# Patient Record
Sex: Female | Born: 1995 | Race: Black or African American | Hispanic: No | Marital: Single | Smoking: Never smoker
Health system: Southern US, Community
[De-identification: ages and names within clinical notes are randomized; demographics above are authoritative.]

## PROBLEM LIST (undated history)

## (undated) HISTORY — PX: NO PAST SURGERIES: SHX2092

---
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
## 2018-03-20 ENCOUNTER — Other Ambulatory Visit: Payer: Self-pay

## 2018-03-20 ENCOUNTER — Other Ambulatory Visit (HOSPITAL_BASED_OUTPATIENT_CLINIC_OR_DEPARTMENT_OTHER): Payer: Self-pay

## 2018-03-29 ENCOUNTER — Other Ambulatory Visit: Payer: Self-pay

## 2018-03-29 ENCOUNTER — Encounter (HOSPITAL_BASED_OUTPATIENT_CLINIC_OR_DEPARTMENT_OTHER): Payer: Self-pay

## 2018-03-29 ENCOUNTER — Emergency Department (HOSPITAL_BASED_OUTPATIENT_CLINIC_OR_DEPARTMENT_OTHER): Payer: Self-pay

## 2018-03-29 ENCOUNTER — Inpatient Hospital Stay (HOSPITAL_COMMUNITY): Payer: Self-pay

## 2018-03-29 ENCOUNTER — Inpatient Hospital Stay (HOSPITAL_BASED_OUTPATIENT_CLINIC_OR_DEPARTMENT_OTHER)
Admission: EM | Admit: 2018-03-29 | Discharge: 2018-03-29 | Disposition: A | Discharge status: Home / Self Care | Payer: Self-pay | Source: Ambulatory Visit | Attending: Obstetrics and Gynecology | Admitting: Obstetrics and Gynecology

## 2018-03-29 DIAGNOSIS — R1084 Generalized abdominal pain: Secondary | ICD-10-CM | POA: Insufficient documentation

## 2018-03-29 DIAGNOSIS — R42 Dizziness and giddiness: Secondary | ICD-10-CM | POA: Insufficient documentation

## 2018-03-29 DIAGNOSIS — R109 Unspecified abdominal pain: Secondary | ICD-10-CM

## 2018-03-29 DIAGNOSIS — Z79899 Other long term (current) drug therapy: Secondary | ICD-10-CM | POA: Insufficient documentation

## 2018-03-29 DIAGNOSIS — R55 Syncope and collapse: Secondary | ICD-10-CM

## 2018-03-29 DIAGNOSIS — I959 Hypotension, unspecified: Secondary | ICD-10-CM

## 2018-03-29 DIAGNOSIS — N83201 Unspecified ovarian cyst, right side: Secondary | ICD-10-CM | POA: Insufficient documentation

## 2018-03-29 DIAGNOSIS — R58 Hemorrhage, not elsewhere classified: Secondary | ICD-10-CM

## 2018-03-29 DIAGNOSIS — K5900 Constipation, unspecified: Secondary | ICD-10-CM | POA: Insufficient documentation

## 2018-03-29 LAB — I-STAT CG4 LACTIC ACID, ED
LACTIC ACID, VENOUS: 1.65 mmol/L (ref 0.5–1.9)
Lactic Acid, Venous: 3.11 mmol/L (ref 0.5–1.9)

## 2018-03-29 LAB — CBC
HEMATOCRIT: 29.9 % — AB (ref 36.0–46.0)
HEMATOCRIT: 25.7 % — AB (ref 36.0–46.0)
HEMOGLOBIN: 10.4 g/dL — AB (ref 12.0–15.0)
Hemoglobin: 8.7 g/dL — ABNORMAL LOW (ref 12.0–15.0)
MCH: 29.6 pg (ref 26.0–34.0)
MCH: 29.1 pg (ref 26.0–34.0)
MCHC: 34.8 g/dL (ref 30.0–36.0)
MCHC: 33.9 g/dL (ref 30.0–36.0)
MCV: 85.2 fL (ref 78.0–100.0)
MCV: 86 fL (ref 78.0–100.0)
PLATELETS: 218 10*3/uL (ref 150–400)
Platelets: 281 10*3/uL (ref 150–400)
RBC: 3.51 MIL/uL — AB (ref 3.87–5.11)
RBC: 2.99 MIL/uL — ABNORMAL LOW (ref 3.87–5.11)
RDW: 11.8 % (ref 11.5–15.5)
RDW: 12.5 % (ref 11.5–15.5)
WBC: 8.9 10*3/uL (ref 4.0–10.5)
WBC: 9.1 10*3/uL (ref 4.0–10.5)

## 2018-03-29 LAB — TYPE AND SCREEN
ABO/RH(D): O NEG
Antibody Screen: NEGATIVE

## 2018-03-29 LAB — CBG MONITORING, ED: Glucose-Capillary: 164 mg/dL — ABNORMAL HIGH (ref 65–99)

## 2018-03-29 LAB — PREGNANCY, URINE: PREG TEST UR: NEGATIVE

## 2018-03-29 LAB — HEPATIC FUNCTION PANEL
ALT: 13 U/L — AB (ref 14–54)
AST: 29 U/L (ref 15–41)
Albumin: 3.8 g/dL (ref 3.5–5.0)
Alkaline Phosphatase: 50 U/L (ref 38–126)
Bilirubin, Direct: 0.2 mg/dL (ref 0.1–0.5)
Indirect Bilirubin: 0.7 mg/dL (ref 0.3–0.9)
TOTAL PROTEIN: 6.4 g/dL — AB (ref 6.5–8.1)
Total Bilirubin: 0.9 mg/dL (ref 0.3–1.2)

## 2018-03-29 LAB — URINALYSIS, ROUTINE W REFLEX MICROSCOPIC
Bilirubin Urine: NEGATIVE
Glucose, UA: 100 mg/dL — AB
Hgb urine dipstick: NEGATIVE
Ketones, ur: NEGATIVE mg/dL
Nitrite: NEGATIVE
PH: 6 (ref 5.0–8.0)
Protein, ur: NEGATIVE mg/dL
SPECIFIC GRAVITY, URINE: 1.025 (ref 1.005–1.030)

## 2018-03-29 LAB — BASIC METABOLIC PANEL
ANION GAP: 11 (ref 5–15)
BUN: 12 mg/dL (ref 6–20)
CO2: 18 mmol/L — ABNORMAL LOW (ref 22–32)
Calcium: 8.8 mg/dL — ABNORMAL LOW (ref 8.9–10.3)
Chloride: 103 mmol/L (ref 101–111)
Creatinine, Ser: 0.76 mg/dL (ref 0.44–1.00)
GFR calc Af Amer: >60 mL/min (ref >60)
GFR calc non Af Amer: >60 mL/min (ref >60)
GLUCOSE: 166 mg/dL — AB (ref 65–99)
POTASSIUM: 3.5 mmol/L (ref 3.5–5.1)
SODIUM: 132 mmol/L — AB (ref 135–145)

## 2018-03-29 LAB — DIFFERENTIAL
BASOS ABS: 0 10*3/uL (ref 0.0–0.1)
BASOS PCT: 0 %
EOS ABS: 0 10*3/uL (ref 0.0–0.7)
Eosinophils Relative: 0 %
Lymphocytes Relative: 21 %
Lymphs Abs: 1.9 10*3/uL (ref 0.7–4.0)
MONO ABS: 0.4 10*3/uL (ref 0.1–1.0)
Monocytes Relative: 5 %
Neutro Abs: 6.6 10*3/uL (ref 1.7–7.7)
Neutrophils Relative %: 74 %

## 2018-03-29 LAB — OCCULT BLOOD X 1 CARD TO LAB, STOOL: FECAL OCCULT BLD: NEGATIVE

## 2018-03-29 LAB — ABO/RH: ABO/RH(D): O NEG

## 2018-03-29 LAB — LIPASE, BLOOD: Lipase: 23 U/L (ref 11–51)

## 2018-03-29 LAB — URINALYSIS, MICROSCOPIC (REFLEX)

## 2018-03-29 LAB — HEMOGLOBIN AND HEMATOCRIT, BLOOD
HCT: 24.8 % — ABNORMAL LOW (ref 36.0–46.0)
Hemoglobin: 8.5 g/dL — ABNORMAL LOW (ref 12.0–15.0)

## 2018-03-29 LAB — HCG, QUANTITATIVE, PREGNANCY: hCG, Beta Chain, Quant, S: <1 m[IU]/mL (ref <5)

## 2018-03-29 MED ORDER — PIPERACILLIN-TAZOBACTAM 3.375 G IVPB 30 MIN
3.3750 g | Freq: Once | INTRAVENOUS | Status: AC
  Administered 2018-03-29: 3.375 g via INTRAVENOUS
  Filled 2018-03-29 (×2): qty 50

## 2018-03-29 MED ORDER — IOPAMIDOL (ISOVUE-300) INJECTION 61%
100.0000 mL | Freq: Once | INTRAVENOUS | Status: AC | PRN
  Administered 2018-03-29: 100 mL via INTRAVENOUS

## 2018-03-29 MED ORDER — SODIUM CHLORIDE 0.9 % IV SOLN
1000.0000 mL | INTRAVENOUS | Status: DC
  Administered 2018-03-29: 1000 mL via INTRAVENOUS

## 2018-03-29 MED ORDER — VANCOMYCIN HCL IN DEXTROSE 1-5 GM/200ML-% IV SOLN
1000.0000 mg | Freq: Once | INTRAVENOUS | Status: AC
  Administered 2018-03-29: 1000 mg via INTRAVENOUS
  Filled 2018-03-29: qty 200

## 2018-03-29 MED ORDER — SODIUM CHLORIDE 0.9 % IV BOLUS
1000.0000 mL | Freq: Once | INTRAVENOUS | Status: AC
  Administered 2018-03-29: 1000 mL via INTRAVENOUS

## 2018-03-29 MED ORDER — FENTANYL CITRATE (PF) 100 MCG/2ML IJ SOLN
50.0000 ug | Freq: Once | INTRAMUSCULAR | Status: AC
  Administered 2018-03-29: 50 ug via INTRAVENOUS
  Filled 2018-03-29: qty 2

## 2018-03-29 MED ORDER — ONDANSETRON HCL 4 MG/2ML IJ SOLN
4.0000 mg | Freq: Once | INTRAMUSCULAR | Status: AC
Start: 2018-03-29 — End: 2018-03-29
  Administered 2018-03-29: 4 mg via INTRAVENOUS
  Filled 2018-03-29: qty 2

## 2018-03-29 NOTE — ED Provider Notes (Signed)
TIME SEEN: 1:45 AM  CHIEF COMPLAINT: Abdominal pain, syncope  HPI: Patient is a 22 year old female with no significant past medical history who presents to the emergency department with complaints of constipation.  States she has not had a bowel movement since Sunday.  States at that time it was hard but she did not notice any blood or melena.  States that at home with her sister she had a syncopal event prior to arrival where she felt very lightheaded and short of breath.  She did not have any chest pain.  No chest pain or shortness of breath at this time.  She states she thinks she felt short of breath just because of the abdominal pain.  Most the pain is throughout the lower right and left abdomen.  She describes as a 5/10 at this time.  No nausea or vomiting.  No diarrhea.  No fever.  No cough or congestion.  No history of abdominal surgeries.  She is sexually active.  Last sexual encounter was in February.  Last menstrual period was March 5.  She has never been pregnant.  She reports history of abnormal menstrual cycles.  No history of heavy vaginal bleeding.  She has never been pregnant.  She does not have a PCP or OB/GYN.  She has never been told that she is anemic.  No history of blood transfusion.  She denies dysuria, hematuria, vaginal bleeding or discharge.  She reports her blood pressure is normally in the 110/70s.  Not on blood pressure medication.   ROS: See HPI Constitutional: no fever  Eyes: no drainage  ENT: no runny nose   Cardiovascular:  no chest pain  Resp: no SOB  GI: no vomiting GU: no dysuria Integumentary: no rash  Allergy: no hives  Musculoskeletal: no leg swelling  Neurological: no slurred speech ROS otherwise negative  PAST MEDICAL HISTORY/PAST SURGICAL HISTORY:  History reviewed. No pertinent past medical history.  MEDICATIONS:  Prior to Admission medications   Medication Sig Start Date End Date Taking? Authorizing Provider  polyethylene glycol powder (MIRALAX)  powder Take 17 g by mouth daily. 11/26/15   Palumbo, April, MD    ALLERGIES:  No Known Allergies  SOCIAL HISTORY:  Social History   Tobacco Use  . Smoking status: Never Smoker  . Smokeless tobacco: Never Used  Substance Use Topics  . Alcohol use: No    FAMILY HISTORY: History reviewed. No pertinent family history.  EXAM: BP 91/66   Pulse 90   Temp 98 F (36.7 C) (Oral)   Resp 16   Ht 5\' 2"  (1.575 m)   Wt 54.6 kg (120 lb 5.9 oz)   LMP 02/07/2018   SpO2 100%   BMI 22.02 kg/m  CONSTITUTIONAL: Alert and oriented and responds appropriately to questions. Well-appearing; well-nourished smiling, in no distress, afebrile HEAD: Normocephalic EYES: Conjunctivae appear pale, pupils appear equal, EOMI ENT: normal nose; moist mucous membranes NECK: Supple, no meningismus, no nuchal rigidity, no LAD  CARD: Regular and tachycardic; S1 and S2 appreciated; no murmurs, no clicks, no rubs, no gallops RESP: Normal chest excursion without splinting or tachypnea; breath sounds clear and equal bilaterally; no wheezes, no rhonchi, no rales, no hypoxia or respiratory distress, speaking full sentences ABD/GI: Normal bowel sounds; non-distended; soft, mildly tender throughout the lower abdomen, no rebound, no guarding, no peritoneal signs, no hepatosplenomegaly RECTAL:  Normal rectal tone, no gross blood or melena, guaiac negative, no hemorrhoids appreciated, nontender rectal exam, no fecal impaction BACK:  The back appears  normal and is non-tender to palpation, there is no CVA tenderness EXT: Normal ROM in all joints; non-tender to palpation; no edema; normal capillary refill; no cyanosis, no calf tenderness or swelling    SKIN: Normal color for age and race; warm; no rash NEURO: Moves all extremities equally PSYCH: The patient's mood and manner are appropriate. Grooming and personal hygiene are appropriate.  MEDICAL DECISION MAKING: Patient here with lower abdominal pain, syncope.  Differential  concerning for ectopic pregnancy, perforated viscus, sepsis.  We will start IV fluids and obtain labs, urine and upright chest and abdominal x-rays.  EKG shows no ischemic abnormality.  I doubt that this is pulmonary embolus especially in the setting of not having chest pain or shortness of breath currently.  ED PROGRESS: Patient's rectal temperature is normal.  She does have an elevated lactate and with tachycardia and hypotension I have initiated sepsis protocol.  She will received 2 L of IV fluids as well as broad-spectrum antibiotics.   Labs reassuring.  No leukocytosis.  Normal creatinine, LFTs and lipase.  Chest and abdominal x-rays have been reviewed by myself as well as radiology and showed no acute abnormality.  Pregnancy test is negative.  Urine shows no obvious sign of infection.  It appears to be a contaminated sample.  She is not having urinary symptoms.  Blood pressure and heart rate improving with IV hydration.  Will give fentanyl for pain control.  She is still mentating normally, smiling and laughing.  Will obtain CT scan for further evaluation.  Concern for possible appendicitis.   Patient's repeat lactate has improved.  Discussed with Dr. Cherly Hensenhang with radiology.  He states that patient appears to have active hemorrhage coming from a cystic structure noted on the right adnexa.  He states that this appears more concerning for ruptured ectopic pregnancy but she does have a negative urine pregnancy here.  Will add on a quantitative hCG now.  She does not have an OB/GYN.  Will discuss with [facility] emergent [facility].  Radiology states that this could be ruptured ovarian cyst but seems to be more blood than he would expect from a cyst.  5:00 AM  D/w Dr. Earlene Plateravis on call for OBGYN.  Appreciate her help.  She agrees with emergent [facility].  We will repeat H&H at this time.  Blood pressure is in the 100/70s and heart rate in the 110's.  Patient has  been updated with this plan.  She is n.p.o. at this time.   5:30 AM  Pt's repeat hemoglobin is 8.5 down from 10.4.  Her hCG quant is 0.  [facility] at bedside for [facility].   I reviewed all nursing notes, vitals, pertinent previous records, EKGs, lab and urine results, imaging (as available).     EKG Interpretation  Date/Time:  Tuesday March 29 2018 02:45:08 EDT Ventricular Rate:  140 PR Interval:    QRS Duration: 75 QT Interval:  394 QTC Calculation: 602 R Axis:   85 Text Interpretation:  Sinus tachycardia Borderline T wave abnormalities Prolonged QT interval Confirmed by Rochele RaringWard, Kristen [phone]([id number]) on 03/29/2018 5:04:33 AM            CRITICAL CARE Performed by: Baxter HireKristen Ward   Total critical care time: 60 minutes  Critical care time was exclusive of separately billable procedures and treating other patients.  Critical care was necessary to treat or prevent imminent or life-threatening deterioration.  Critical care was time spent personally  by me on the following activities: development of treatment plan with patient and/or surrogate as well as nursing, discussions with consultants, evaluation of patient's response to treatment, examination of patient, obtaining history from patient or surrogate, ordering and performing treatments and interventions, ordering and review of laboratory studies, ordering and review of radiographic studies, pulse oximetry and re-evaluation of patient's condition.    Ward, Layla Maw, DO 03/29/18 [phone]

## 2018-03-29 NOTE — ED Notes (Signed)
Critical Lactic result handed to Dr Elesa MassedWard.

## 2018-03-29 NOTE — ED Notes (Signed)
Lactic results given to Dr Ward. 

## 2018-03-29 NOTE — MAU Provider Note (Addendum)
Patient Misty Gordon is a [age] y.o. G0P0000 Non-pregnant female here with abdominal pain. She developed abdominal pain and dizziness last night at midnight and [facility]  after her CT showed large amount of blood in her abdomen, although beta hcg was less than 1.   Patient had vomiting while en route and received 4 mg of Zofran. [facility] two liters of fluid.  History     CSN: [id number]  Arrival date and time: 03/29/18 0120   None     Chief Complaint  Patient presents with  . Loss of Consciousness   Loss of Consciousness  Associated symptoms include abdominal pain. Pertinent negatives include no vomiting.  Abdominal Pain  This is a new problem. The current episode started yesterday. The onset quality is gradual. The pain is located in the generalized abdominal region and suprapubic region. The pain is at a severity of 6/10. The quality of the pain is cramping and sharp. Pertinent negatives include no diarrhea or vomiting.  She vomited around 7 pm; she had a BM around  3pm on Monday. Before that she had a BM on Sunday. Both BM were small.   Her pain picked around 8 pm; it slowed down at 9 pm. She got dizzy (she did not lose consciousness) at midnight and went to urgent care, where they did a pregnancy test, blood work, CT showed bleeding in her abdomen.   She only had spinach yesterday bc she was not hungry. Sunday she chipotle, biscuit, and a [place] barbecue sandwich and fries.   OB History    Gravida  0   Para  0   Term  0   Preterm  0   AB  0   Living  0     SAB  0   TAB  0   Ectopic  0   Multiple  0   Live Births  0           History reviewed. No pertinent past medical history.  Past Surgical History:  Procedure Laterality Date  . NO PAST SURGERIES      History reviewed. No pertinent family history.  Social History   Tobacco Use  . Smoking status: Never Smoker  . Smokeless tobacco: Never Used  Substance Use  Topics  . Alcohol use: No  . Drug use: No    Allergies: No Known Allergies  Medications Prior to Admission  Medication Sig Dispense Refill Last Dose  . polyethylene glycol powder (MIRALAX) powder Take 17 g by mouth daily. 255 g 0 More than a month at Unknown time    Review of Systems  Respiratory: Negative.   Cardiovascular: Positive for syncope.  Gastrointestinal: Positive for abdominal pain. Negative for diarrhea and vomiting.  Genitourinary: Negative.   Neurological: Positive for syncope.   Physical Exam   Blood pressure (!) 102/52, pulse (!) 108, temperature 98.6 F (37 C), temperature source Oral, resp. rate 17, height 5\' 2"  (1.575 m), weight 120 lb 5.9 oz (54.6 kg), last menstrual period 02/07/2018, SpO2 100 %.  Physical Exam  Constitutional: She is oriented to person, place, and time. She appears well-developed.  HENT:  Head: Normocephalic.  Neck: Normal range of motion.  GI: Soft. She exhibits no distension and no mass. There is no tenderness. There is no rebound and no guarding.  Musculoskeletal: Normal range of motion.  Neurological: She is alert and oriented to person, place, and time.  Skin: Skin is warm.  MAU Course  Procedures  MDM Plan of care at 0740: Repeat beta hcg and CBC -continue NPO  Patient continues to be alert and oriented times 3, abdomen is soft and non-tender. Patient is smiling and chatty with pain level a 3/10. She has received no pain medication while in MAU.   -of note, her Hemoglobin dropped from 10.4 at 1 am to 8.5 at 5 am, although this may be due to hemodilution.   -Dr. Earlene Plater at the bedside; will discuss with Dr. Macon Large and determine plan of care.  Patient care endorsed to [facility] at 0800.   Charlesetta Garibaldi Kooistra 03/29/2018, 7:01 AM    Attestation of Attending Supervision of Advanced Practice Provider (PA/CNM/NP): Evaluation and management procedures were performed by the Advanced Practice Provider under my supervision and  collaboration.  I have reviewed the Advanced Practice Provider's note and chart, and I agree with the management and plan.  Patient examined, vitals stable, soft abdomen, repeat Hgb is 8.7.  Discussed with patient and her family. No need for operative intervention at this point.  NSAIDs recommended for pain as needed. She will follow up with GYN in 2-3 weeks; message sent to [facility] to make appointment.  Bleeding and pain precautions reviewed with patient.     Jaynie Collins, MD, FACOG Obstetrician & Gynecologist, [facility] for [employer], [facility]

## 2018-03-29 NOTE — ED Triage Notes (Signed)
Pt reports witnessed syncopal episode, constipation since Sunday, and 5/10 abd pain. Pt denies n/v. Pt hypotensive in triage.75/50 BP

## 2018-03-29 NOTE — Discharge Instructions (Signed)
Ovarian Cyst An ovarian cyst is a fluid-filled sac on an ovary. The ovaries are organs that make eggs in women. Most ovarian cysts go away on their own and are not cancerous (are benign). Some cysts need treatment. Follow these instructions at home:  Take over-the-counter and prescription medicines only as told by your doctor.  Do not drive or use heavy machinery while taking prescription pain medicine.  Get pelvic exams and Pap tests as often as told by your doctor.  Return to your normal activities as told by your doctor. Ask your doctor what activities are safe for you.  Do not use any products that contain nicotine or tobacco, such as cigarettes and e-cigarettes. If you need help quitting, ask your doctor.  Keep all follow-up visits as told by your doctor. This is important. Contact a doctor if:  Your periods are: ? Late. ? Irregular. ? Painful.  Your periods stop.  You have pelvic pain that does not go away.  You have pressure on your bladder.  You have trouble making your bladder empty when you pee (urinate).  You have pain during sex.  You have any of the following in your belly (abdomen): ? A feeling of fullness. ? Pressure. ? Discomfort. ? Pain that does not go away. ? Swelling.  You feel sick most of the time.  You have trouble pooping (have constipation).  You are not as hungry as usual (you lose your appetite).  You get very bad acne.  You start to have more hair on your body and face.  You are gaining weight or losing weight without changing your exercise and eating habits.  You think you may be pregnant. Get help right away if:  You have belly pain that is very bad or gets worse.  You cannot eat or drink without throwing up (vomiting).  You suddenly get a fever.  Your period is a lot heavier than usual. This information is not intended to replace advice given to you by your health care provider. Make sure you discuss any questions you have  with your health care provider. Document Released: 05/11/2008 Document Revised: 06/12/2016 Document Reviewed: 04/26/2016 Elsevier Interactive Patient Education  2018 [employer]  

## 2018-04-03 LAB — CULTURE, BLOOD (ROUTINE X 2)
CULTURE: NO GROWTH
Culture: NO GROWTH
SPECIAL REQUESTS: ADEQUATE

## 2018-04-20 ENCOUNTER — Encounter: Payer: Self-pay | Admitting: Obstetrics & Gynecology

## 2018-05-12 ENCOUNTER — Ambulatory Visit: Payer: BLUE CROSS/BLUE SHIELD | Admitting: Obstetrics and Gynecology

## 2018-05-12 ENCOUNTER — Encounter: Payer: Self-pay | Admitting: Obstetrics and Gynecology

## 2018-05-12 VITALS — BP 111/71 | HR 85 | Ht 62.0 in | Wt 112.0 lb

## 2018-05-12 DIAGNOSIS — Z09 Encounter for follow-up examination after completed treatment for conditions other than malignant neoplasm: Secondary | ICD-10-CM

## 2018-05-12 MED ORDER — LO LOESTRIN FE 1 MG-10 MCG / 10 MCG PO TABS: 1.0000 | ORAL_TABLET | Freq: Every day | ORAL | 3 refills | Status: AC

## 2018-05-12 NOTE — Progress Notes (Signed)
22 yo G0 here for follow up from and ED visit on 4/23. Patient seen and discharged on 4/23 with likely bleeding hemorrhagic cyst. Patient reports feeling much better and without pain since her ED visit. She is without complaints. She is not currently sexually active and is interested in contraception. She reports a monthly 5-day period. She denies pelvic pain or abnormal discharge  No past medical history on file. Past Surgical History:  Procedure Laterality Date  . NO PAST SURGERIES     No family history on file. Social History   Tobacco Use  . Smoking status: Never Smoker  . Smokeless tobacco: Never Used  Substance Use Topics  . Alcohol use: No  . Drug use: No   ROS See pertinent in HPI  Blood pressure 111/71, pulse 85, height 5\' 2"  (1.575 m), weight 112 lb (50.8 kg).  GENERAL: Well-developed, well-nourished female in no acute distress.  ABDOMEN: Soft, nontender, nondistended. No organomegaly. PELVIC: Not performed EXTREMITIES: No cyanosis, clubbing, or edema, 2+ distal pulses.  A/P 22 yo G0 with h/o ruptured hemorrhagic cyst - Reassurance provided - Contraception options reviewed and patient is interested in COC. Rx Lo Loestrin provided - patient to return in fall for annual exam  - advised the continued usage of condoms for STD prevention

## 2018-05-12 NOTE — Patient Instructions (Signed)
Contraception Choices Contraception, also called birth control, refers to methods or devices that prevent pregnancy. Hormonal methods Contraceptive implant A contraceptive implant is a thin, plastic tube that contains a hormone. It is inserted into the upper part of the arm. It can remain in place for up to 3 years. Progestin-only injections Progestin-only injections are injections of progestin, a synthetic form of the hormone progesterone. They are given every 3 months by a health care provider. Birth control pills Birth control pills are pills that contain hormones that prevent pregnancy. They must be taken once a day, preferably at the same time each day. Birth control patch The birth control patch contains hormones that prevent pregnancy. It is placed on the skin and must be changed once a week for three weeks and removed on the fourth week. A prescription is needed to use this method of contraception. Vaginal ring A vaginal ring contains hormones that prevent pregnancy. It is placed in the vagina for three weeks and removed on the fourth week. After that, the process is repeated with a new ring. A prescription is needed to use this method of contraception. Emergency contraceptive Emergency contraceptives prevent pregnancy after unprotected sex. They come in pill form and can be taken up to 5 days after sex. They work best the sooner they are taken after having sex. Most emergency contraceptives are available without a prescription. This method should not be used as your only form of birth control. Barrier methods Female condom A female condom is a thin sheath that is worn over the penis during sex. Condoms keep sperm from going inside a woman's body. They can be used with a spermicide to increase their effectiveness. They should be disposed after a single use. Female condom A female condom is a soft, loose-fitting sheath that is put into the vagina before sex. The condom keeps sperm from going  inside a woman's body. They should be disposed after a single use. Diaphragm A diaphragm is a soft, dome-shaped barrier. It is inserted into the vagina before sex, along with a spermicide. The diaphragm blocks sperm from entering the uterus, and the spermicide kills sperm. A diaphragm should be left in the vagina for 6-8 hours after sex and removed within 24 hours. A diaphragm is prescribed and fitted by a health care provider. A diaphragm should be replaced every 1-2 years, after giving birth, after gaining more than 15 lb (6.8 kg), and after pelvic surgery. Cervical cap A cervical cap is a round, soft latex or plastic cup that fits over the cervix. It is inserted into the vagina before sex, along with spermicide. It blocks sperm from entering the uterus. The cap should be left in place for 6-8 hours after sex and removed within 48 hours. A cervical cap must be prescribed and fitted by a health care provider. It should be replaced every 2 years. Sponge A sponge is a soft, circular piece of polyurethane foam with spermicide on it. The sponge helps block sperm from entering the uterus, and the spermicide kills sperm. To use it, you make it wet and then insert it into the vagina. It should be inserted before sex, left in for at least 6 hours after sex, and removed and thrown away within 30 hours. Spermicides Spermicides are chemicals that kill or block sperm from entering the cervix and uterus. They can come as a cream, jelly, suppository, foam, or tablet. A spermicide should be inserted into the vagina with an applicator at least 10-15 minutes before   sex to allow time for it to work. The process must be repeated every time you have sex. Spermicides do not require a prescription. Intrauterine contraception Intrauterine device (IUD) An IUD is a T-shaped device that is put in a woman's uterus. There are two types:  Hormone IUD.This type contains progestin, a synthetic form of the hormone progesterone. This  type can stay in place for 3-5 years.  Copper IUD.This type is wrapped in copper wire. It can stay in place for 10 years.  Permanent methods of contraception Female tubal ligation In this method, a woman's fallopian tubes are sealed, tied, or blocked during surgery to prevent eggs from traveling to the uterus. Hysteroscopic sterilization In this method, a small, flexible insert is placed into each fallopian tube. The inserts cause scar tissue to form in the fallopian tubes and block them, so sperm cannot reach an egg. The procedure takes about 3 months to be effective. Another form of birth control must be used during those 3 months. Female sterilization This is a procedure to tie off the tubes that carry sperm (vasectomy). After the procedure, the man can still ejaculate fluid (semen). Natural planning methods Natural family planning In this method, a couple does not have sex on days when the woman could become pregnant. Calendar method This means keeping track of the length of each menstrual cycle, identifying the days when pregnancy can happen, and not having sex on those days. Ovulation method In this method, a couple avoids sex during ovulation. Symptothermal method This method involves not having sex during ovulation. The woman typically checks for ovulation by watching changes in her temperature and in the consistency of cervical mucus. Post-ovulation method In this method, a couple waits to have sex until after ovulation. Summary  Contraception, also called birth control, means methods or devices that prevent pregnancy.  Hormonal methods of contraception include implants, injections, pills, patches, vaginal rings, and emergency contraceptives.  Barrier methods of contraception can include female condoms, female condoms, diaphragms, cervical caps, sponges, and spermicides.  There are two types of IUDs (intrauterine devices). An IUD can be put in a woman's uterus to prevent pregnancy  for 3-5 years.  Permanent sterilization can be done through a procedure for males, females, or both.  Natural family planning methods involve not having sex on days when the woman could become pregnant. This information is not intended to replace advice given to you by your health care provider. Make sure you discuss any questions you have with your health care provider. Document Released: 11/23/2005 Document Revised: 12/26/2016 Document Reviewed: 12/26/2016 Elsevier Interactive Patient Education  2018 [employer]  

## 2018-06-14 IMAGING — CT CT ABD-PELV W/ CM
2 of 4 series · 15 of 46 positions shown, 17 images · IV contrast (iopamidol)
Comparison: None.

CLINICAL DATA: Acute onset of generalized abdominal pain. Syncope.
Hypotension.

EXAM:
CT ABDOMEN AND PELVIS WITH CONTRAST
TECHNIQUE: Multidetector CT imaging of the abdomen and pelvis was performed
using the standard protocol following bolus administration of
intravenous contrast.
CONTRAST:  100mL PCAJAA-4SS IOPAMIDOL (PCAJAA-4SS) INJECTION 61%

[Series 2: axial st · axial · 0.64mm/px · z∈[-468,-58]mm · 12 of 90 slices shown, 14 images]
[im 4/90  soft-tissue]
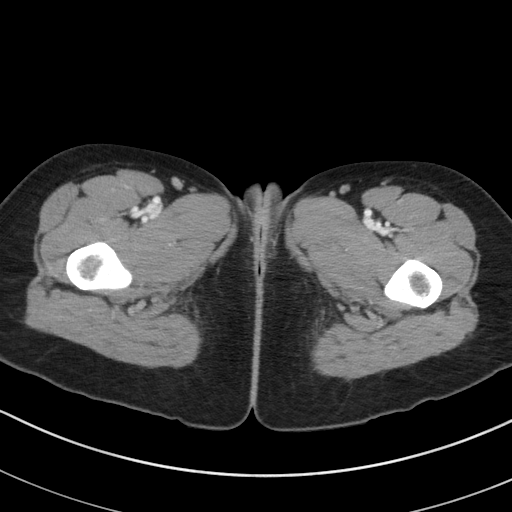
[im 4/90  bone]
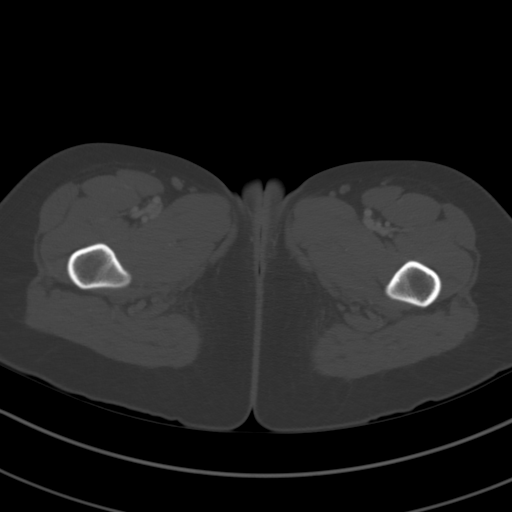
[im 12/90  soft-tissue]
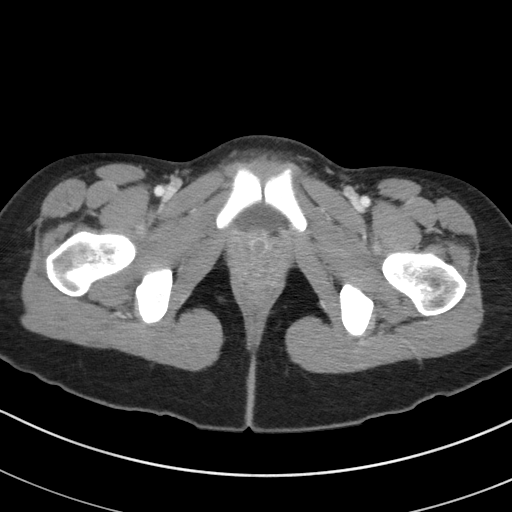
[im 19/90  soft-tissue]
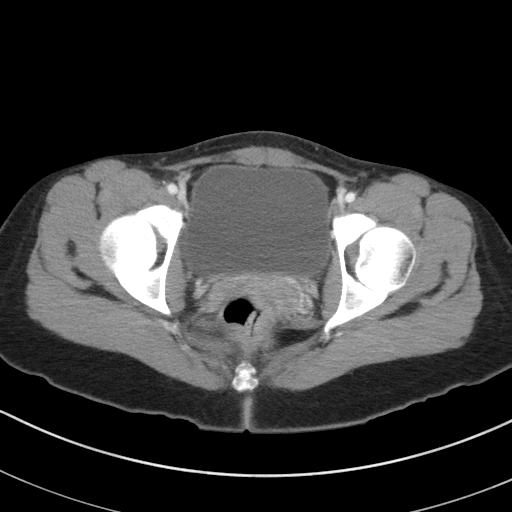
[im 26/90  soft-tissue]
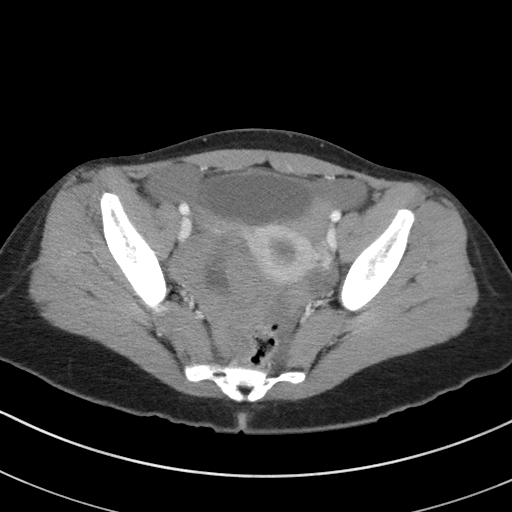
[im 34/90  soft-tissue]
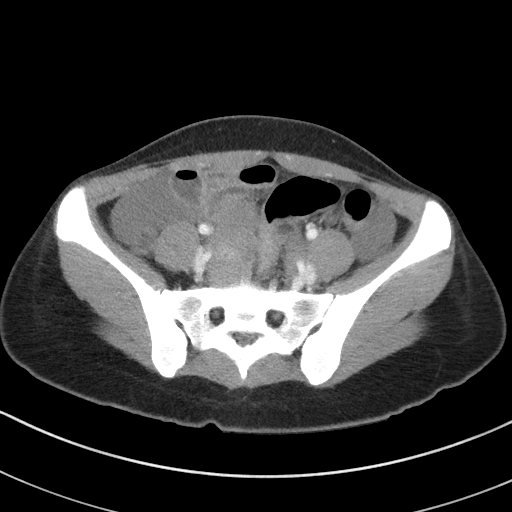
[im 41/90  soft-tissue]
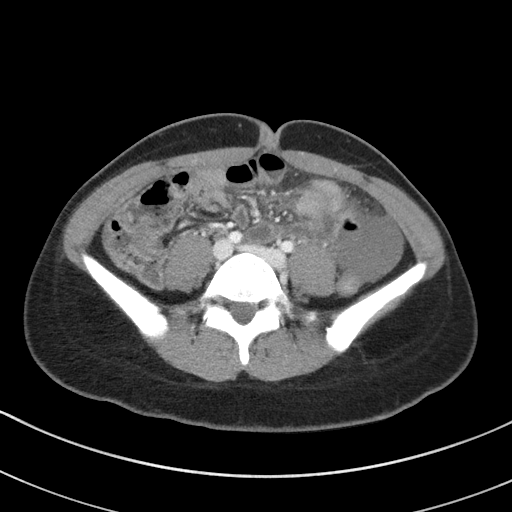
[im 49/90  soft-tissue]
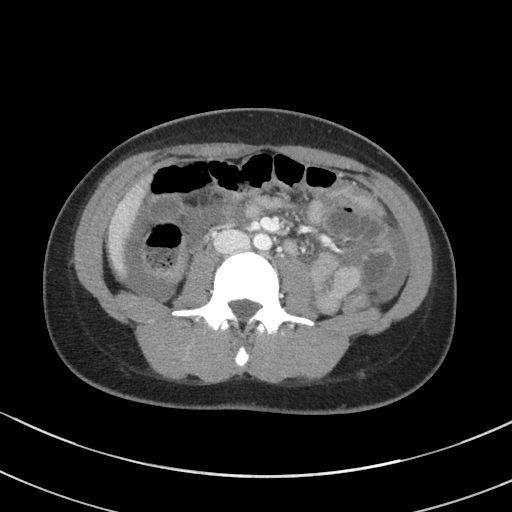
[im 56/90  soft-tissue]
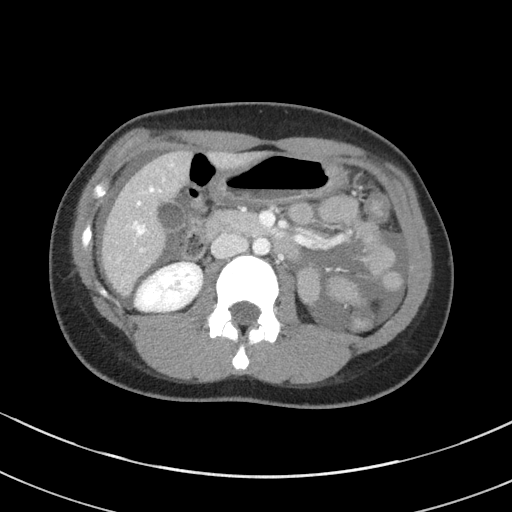
[im 64/90  soft-tissue]
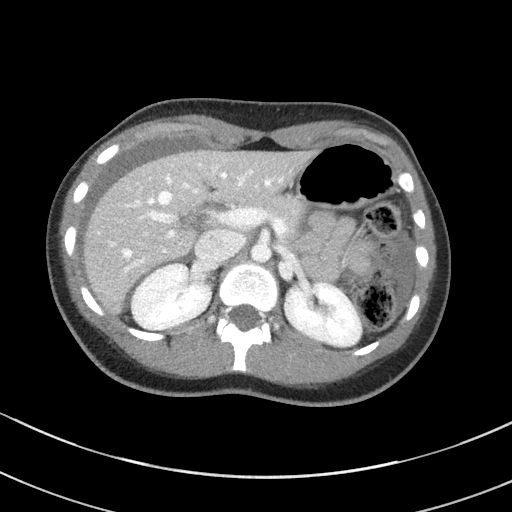
[im 64/90  bone]
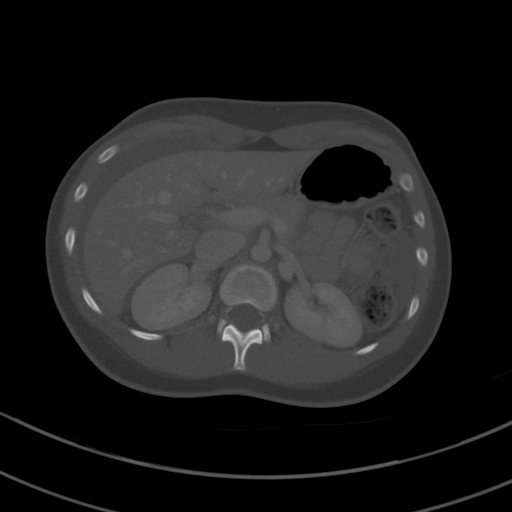
[im 71/90  soft-tissue]
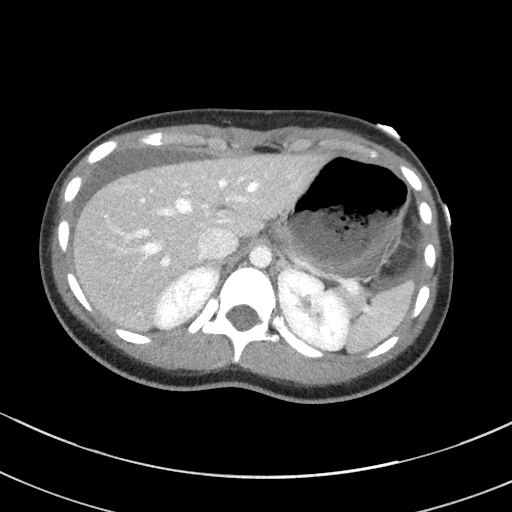
[im 78/90  soft-tissue]
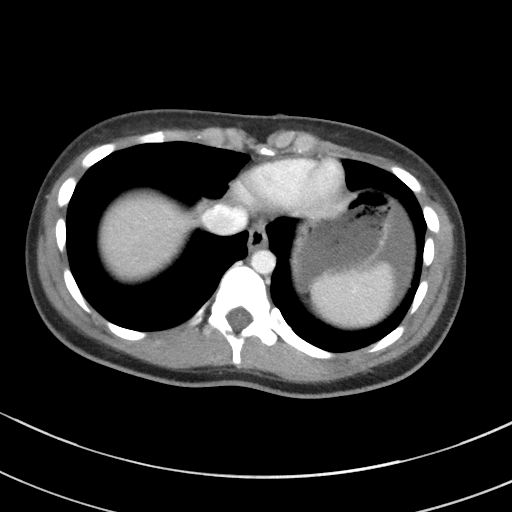
[im 86/90  soft-tissue]
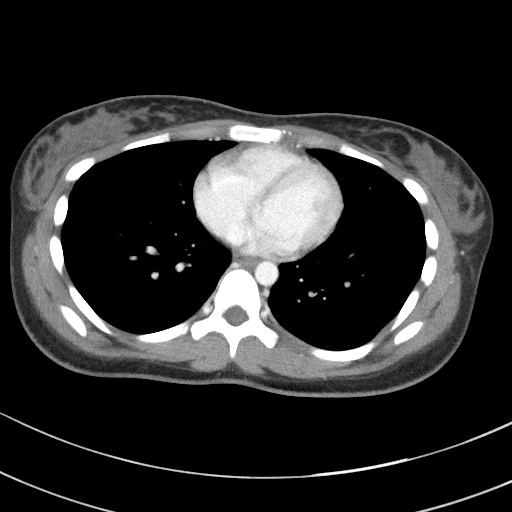

[Series 5: coronal st · coronal · 0.68mm/px · 3 of 64 slices shown]
[im 22/64  soft-tissue]
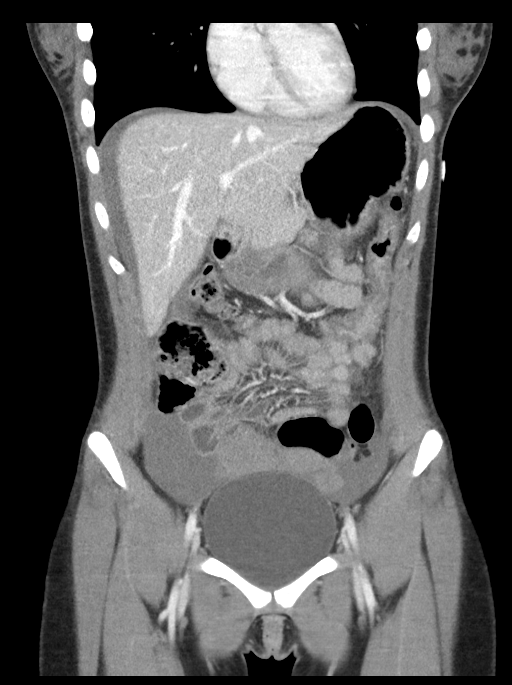
[im 29/64  soft-tissue]
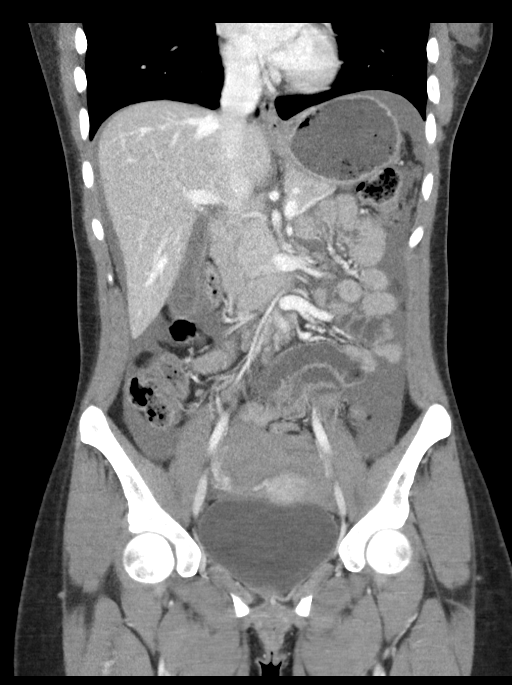
[im 36/64  soft-tissue]
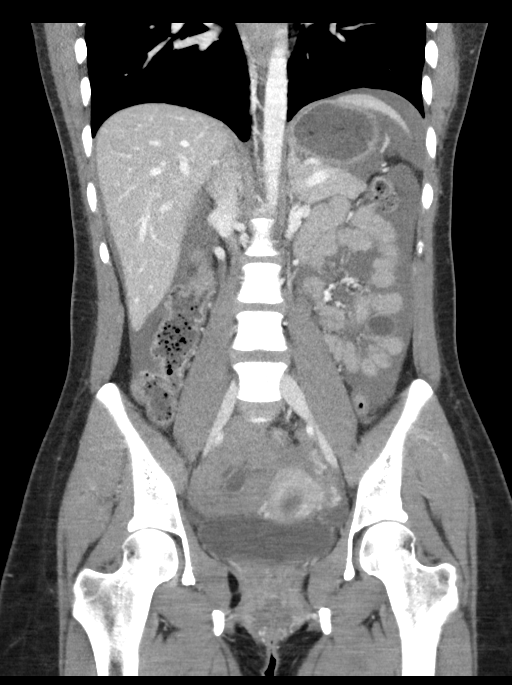

[15 of 46 positions shown; findings below may reference images not displayed]

FINDINGS: Lower chest: The visualized lung bases are grossly clear. The
visualized portions of the mediastinum are unremarkable.

Hepatobiliary: The liver is unremarkable in appearance. The
gallbladder is unremarkable in appearance. The common bile duct
remains normal in caliber.

Pancreas: The pancreas is within normal limits.

Spleen: The spleen is unremarkable in appearance.

Adrenals/Urinary Tract: The adrenal glands are unremarkable in
appearance. The kidneys are within normal limits. There is no
evidence of hydronephrosis. No renal or ureteral stones are
identified. No perinephric stranding is seen.

Stomach/Bowel: The stomach is unremarkable in appearance. The small
bowel is within normal limits. The appendix is normal in caliber,
without evidence of appendicitis. The colon is unremarkable in
appearance.

Vascular/Lymphatic: The abdominal aorta is unremarkable in
appearance. The inferior vena cava is grossly unremarkable. No
retroperitoneal lymphadenopathy is seen. No pelvic sidewall
lymphadenopathy is identified.

Reproductive: The bladder is mildly distended and grossly
unremarkable. The uterus and left ovary are unremarkable in
appearance.

There appears to be a complex 4.5 cm cystic structure at the right
adnexa, surrounded by a relatively large amount of clot in the
pelvis. Blood is seen tracking superiorly into abdomen, surrounding
the liver and spleen. This could conceivably reflect a ruptured
hemorrhagic cyst, though the quantity of blood is much more than
typically seen. Ruptured ectopic pregnancy is a concern, even given
the negative urine pregnancy test.

Other: No additional soft tissue abnormalities are seen.

Musculoskeletal: No acute osseous abnormalities are identified. The
visualized musculature is unremarkable in appearance.
IMPRESSION: Complex 4.5 cm cystic structure at the right adnexa, surrounded by a
relatively large amount of clot in the pelvis. Blood tracks
superiorly into the abdomen, surrounding the liver and spleen. This
could conceivably reflect a ruptured hemorrhagic cyst, though the
quantity of blood is much more than typically seen. Ruptured ectopic
pregnancy is a concern, even given the negative urine pregnancy
test.

Critical Value/emergent results were called by telephone at the time
of interpretation on 03/29/2018 at [DATE] to Dr. NAADIA IYKE, who
verbally acknowledged these results.

## 2018-07-05 ENCOUNTER — Telehealth: Payer: Self-pay | Admitting: Obstetrics and Gynecology

## 2018-07-05 NOTE — Telephone Encounter (Signed)
Patient want to get an US to see if she still have cyst

## 2021-10-16 ENCOUNTER — Emergency Department (HOSPITAL_BASED_OUTPATIENT_CLINIC_OR_DEPARTMENT_OTHER)
Admission: EM | Admit: 2021-10-16 | Discharge: 2021-10-16 | Disposition: A | Discharge status: Home / Self Care | Payer: BC Managed Care – PPO | Attending: Emergency Medicine | Admitting: Emergency Medicine

## 2021-10-16 ENCOUNTER — Encounter (HOSPITAL_BASED_OUTPATIENT_CLINIC_OR_DEPARTMENT_OTHER): Payer: Self-pay

## 2021-10-16 ENCOUNTER — Other Ambulatory Visit: Payer: Self-pay

## 2021-10-16 DIAGNOSIS — R059 Cough, unspecified: Secondary | ICD-10-CM | POA: Diagnosis present

## 2021-10-16 DIAGNOSIS — Z20822 Contact with and (suspected) exposure to covid-19: Secondary | ICD-10-CM | POA: Diagnosis not present

## 2021-10-16 DIAGNOSIS — R0982 Postnasal drip: Secondary | ICD-10-CM | POA: Diagnosis not present

## 2021-10-16 DIAGNOSIS — J069 Acute upper respiratory infection, unspecified: Secondary | ICD-10-CM | POA: Diagnosis not present

## 2021-10-16 LAB — RESP PANEL BY RT-PCR (FLU A&B, COVID) ARPGX2
Influenza A by PCR: POSITIVE — AB
Influenza B by PCR: NEGATIVE
SARS Coronavirus 2 by RT PCR: NEGATIVE

## 2021-10-16 MED ORDER — BENZONATATE 100 MG PO CAPS: 100.0000 mg | ORAL_CAPSULE | Freq: Three times a day (TID) | ORAL | 0 refills | Status: AC

## 2021-10-16 MED ORDER — ONDANSETRON 4 MG PO TBDP: ORAL_TABLET | ORAL | 0 refills | Status: AC

## 2021-10-16 NOTE — ED Notes (Signed)
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  Pt. States she has had runny nose and cough.  At times nausea and no diarrhea.

## 2021-10-16 NOTE — Discharge Instructions (Signed)
Take tylenol 2 pills 4 times a day and motrin 4 pills 3 times a day.  Drink plenty of fluids.  Return for worsening shortness of breath, headache, confusion. Follow up with your family doctor.   

## 2021-10-16 NOTE — ED Provider Notes (Signed)
[facility] EMERGENCY DEPARTMENT Provider Note   CSN: [id number] Arrival date & time: 10/16/21  1859     History Chief Complaint  Patient presents with   URI    Misty Gordon is a 25 y.o. female.  25 yo F with a chief complaints of cough congestion and fever.  Is been going on for about 3 to 4 days now.  She started having fevers and chills today.  Mild decreased oral intake.  No abdominal pain nausea or vomiting.  Headache sore throat.  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  The history is provided by the patient and a friend.  URI Presenting symptoms: congestion, cough and fever   Presenting symptoms: no rhinorrhea   Associated symptoms: no arthralgias, no headaches, no myalgias and no wheezing   Illness Severity:  Moderate Onset quality:  Gradual Duration:  4 days Timing:  Constant Progression:  Worsening Chronicity:  New Associated symptoms: congestion, cough and fever   Associated symptoms: no chest pain, no headaches, no myalgias, no nausea, no rhinorrhea, no shortness of breath, no vomiting and no wheezing       History reviewed. No pertinent past medical history.  There are no problems to display for this patient.   Past Surgical History:  Procedure Laterality Date   NO PAST SURGERIES       OB History     Gravida  0   Para  0   Term  0   Preterm  0   AB  0   Living  0      SAB  0   IAB  0   Ectopic  0   Multiple  0   Live Births  0           History reviewed. No pertinent family history.  Social History   Tobacco Use   Smoking status: Never   Smokeless tobacco: Never  Substance Use Topics   Alcohol use: No   Drug use: No    Home Medications Prior to Admission medications   Medication Sig Start Date End Date Taking? Authorizing Provider  benzonatate (TESSALON) 100 MG capsule Take 1 capsule (100 mg total) by mouth every 8 (eight) hours. 10/16/21  Yes Melene Plan, DO  ondansetron (ZOFRAN ODT) 4 MG disintegrating tablet 4mg  ODT q4 hours prn nausea/vomit 10/16/21  Yes Nelvin Tomb, DO  LO LOESTRIN FE 1 MG-10 MCG / 10 MCG tablet Take 1 tablet by mouth daily. 05/12/18   Constant, Peggy, MD    Allergies    Patient has no known allergies.  Review of Systems   Review of Systems  Constitutional:  Positive for fever. Negative for chills.  HENT:  Positive for congestion. Negative for rhinorrhea.   Eyes:  Negative for redness and visual disturbance.  Respiratory:  Positive for cough. Negative for shortness of breath and wheezing.   Cardiovascular:  Negative for chest pain and palpitations.  Gastrointestinal:  Negative for nausea and vomiting.  Genitourinary:  Negative for dysuria and urgency.  Musculoskeletal:  Negative for arthralgias and myalgias.  Skin:  Negative for pallor and wound.  Neurological:  Negative for dizziness and headaches.   Physical Exam Updated Vital Signs BP (!) 126/94 (BP Location: Left Arm)   Pulse (!) 142   Temp (!) 100.4 F (38 C) (Oral)   Resp 16   Ht 5\' 2"  (1.575 m)   Wt 68.7 kg  LMP 10/14/2021 (Approximate)   SpO2 99%   BMI 27.70 kg/m   Physical Exam Vitals and nursing note reviewed.  Constitutional:      General: She is not in acute distress.    Appearance: She is well-developed. She is not diaphoretic.  HENT:     Head: Normocephalic and atraumatic.     Comments: Swollen turbinates, posterior nasal drip, no noted sinus ttp, tm normal bilaterally.   Eyes:     Pupils: Pupils are equal, round, and reactive to light.  Cardiovascular:     Rate and Rhythm: Normal rate and regular rhythm.     Heart sounds: No murmur heard.   No friction rub. No gallop.  Pulmonary:     Effort: Pulmonary effort is normal.     Breath sounds: No wheezing or rales.  Abdominal:     General: There is no distension.     Palpations: Abdomen is soft.     Tenderness: There is no abdominal tenderness.  Musculoskeletal:        General:  No tenderness.     Cervical back: Normal range of motion and neck supple.  Skin:    General: Skin is warm and dry.  Neurological:     Mental Status: She is alert and oriented to person, place, and time.  Psychiatric:        Behavior: Behavior normal.    ED Results / Procedures / Treatments   Labs (all labs ordered are listed, but only abnormal results are displayed) Labs Reviewed  RESP PANEL BY RT-PCR (FLU A&B, COVID) [id number]    EKG None  Radiology No results found.  Procedures Procedures   Medications Ordered in ED Medications - No data to display  ED Course  I have reviewed the triage vital signs and the nursing notes.  Pertinent labs & imaging results that were available during my care of the patient were reviewed by me and considered in my medical decision making (see chart for details).    MDM Rules/Calculators/A&P                           25 yo F with a cc of cough, congestion fevers, chills.  Going on for the past four days.  Worsening.  Well appearing and non toxic. Clear lung sounds, no bacterial source found on exam.    7:31 PM:  I have discussed the diagnosis/risks/treatment options with the patient and believe the pt to be eligible for discharge home to follow-up with PCP. We also discussed returning to the ED immediately if new or worsening sx occur. We discussed the sx which are most concerning (e.g., sudden worsening pain, fever, inability to tolerate by mouth) that necessitate immediate return. Medications administered to the patient during their visit and any new prescriptions provided to the patient are listed below.  Medications given during this visit Medications - No data to display   The patient appears reasonably screen and/or stabilized for discharge and I doubt any other medical condition or other [facility] requiring further screening, evaluation, or treatment in the ED at this time prior to discharge.    Final Clinical Impression(s) / ED  Diagnoses Final diagnoses:  Viral upper respiratory tract infection    Rx / DC Orders ED Discharge Orders          Ordered    benzonatate (TESSALON) 100 MG capsule  Every 8 hours        10/16/21 1923  ondansetron (ZOFRAN ODT) 4 MG disintegrating tablet        10/16/21 1923             Deno Etienne, DO 10/16/21 1931

## 2021-10-16 NOTE — ED Triage Notes (Signed)
Pt c/o chills, bodyaches, cough, congestion, fever. Cough started Tuesday, other symptoms started today.

## 2021-11-05 ENCOUNTER — Ambulatory Visit (HOSPITAL_COMMUNITY)
Admission: EM | Admit: 2021-11-05 | Discharge: 2021-11-05 | Disposition: A | Discharge status: Home / Self Care | Payer: BC Managed Care – PPO | Attending: Family Medicine | Admitting: Family Medicine

## 2021-11-05 ENCOUNTER — Other Ambulatory Visit: Payer: Self-pay

## 2021-11-05 ENCOUNTER — Encounter (HOSPITAL_COMMUNITY): Payer: Self-pay | Admitting: Emergency Medicine

## 2021-11-05 DIAGNOSIS — T7840XA Allergy, unspecified, initial encounter: Secondary | ICD-10-CM | POA: Diagnosis not present

## 2021-11-05 DIAGNOSIS — L509 Urticaria, unspecified: Secondary | ICD-10-CM | POA: Diagnosis not present

## 2021-11-05 MED ORDER — DEXAMETHASONE SODIUM PHOSPHATE 10 MG/ML IJ SOLN
INTRAMUSCULAR | Status: AC
  Filled 2021-11-05: qty 1

## 2021-11-05 MED ORDER — DEXAMETHASONE SODIUM PHOSPHATE 10 MG/ML IJ SOLN
10.0000 mg | Freq: Once | INTRAMUSCULAR | Status: AC
  Administered 2021-11-05: 10 mg via INTRAMUSCULAR

## 2021-11-05 NOTE — ED Provider Notes (Signed)
[facility]    CSN: [id number] Arrival date & time: 11/05/21  1948      History   Chief Complaint Chief Complaint  Patient presents with   Allergic Reaction    HPI Misty Gordon is a 25 y.o. female.   HPI Patient presents today acute onset itching of the palmar surface of bilateral hands, tingling of her upper lip, and acute onset eyelid swelling.  Patient denies ingesting or making contact with any unknown foods or substances.  Patient does have a pet at home which is mostly indoor pet however pet has access to her bedding. She is uncertain if the dog has made contact with something outside which is caused her irritation.  She denies any difficulty breathing.  She has not taken anything since the onset of the symptoms earlier this afternoon.  There are no problems to display for this patient.   Past Surgical History:  Procedure Laterality Date   NO PAST SURGERIES      OB History     Gravida  0   Para  0   Term  0   Preterm  0   AB  0   Living  0      SAB  0   IAB  0   Ectopic  0   Multiple  0   Live Births  0            Home Medications    Prior to Admission medications   Medication Sig Start Date End Date Taking? Authorizing Provider  benzonatate (TESSALON) 100 MG capsule Take 1 capsule (100 mg total) by mouth every 8 (eight) hours. 10/16/21   Melene Plan, DO  LO LOESTRIN FE 1 MG-10 MCG / 10 MCG tablet Take 1 tablet by mouth daily. 05/12/18   Constant, Peggy, MD  ondansetron (ZOFRAN ODT) 4 MG disintegrating tablet 4mg  ODT q4 hours prn nausea/vomit 10/16/21   13/10/22, DO    Family History History reviewed. No pertinent family history.  Social History Social History   Tobacco Use   Smoking status: Never   Smokeless tobacco: Never  Substance Use Topics   Alcohol use: No   Drug use: No     Allergies   Patient has no known allergies.   Review of Systems Review of Systems Pertinent negatives listed in  HPI   Physical Exam Triage Vital Signs ED Triage Vitals  Enc Vitals Group     BP 11/05/21 2025 136/81     Pulse Rate 11/05/21 2025 100     Resp 11/05/21 2025 16     Temp 11/05/21 2025 98.3 F (36.8 C)     Temp Source 11/05/21 2025 Oral     SpO2 11/05/21 2025 100 %     Weight --      Height --      Head Circumference --      Peak Flow --      Pain Score 11/05/21 2023 0     Pain Loc --      Pain Edu? --      Excl. in GC? --    No data found.  Updated Vital Signs BP 136/81 (BP Location: Right Arm)   Pulse 100   Temp 98.3 F (36.8 C) (Oral)   Resp 16   LMP 10/14/2021 (Approximate)   SpO2 100%   Visual Acuity Right Eye Distance:   Left Eye Distance:   Bilateral Distance:    Right Eye Near:   Left  Eye Near:    Bilateral Near:     Physical Exam General appearance: Alert, well developed, well nourished, cooperative  Head: Normocephalic, without obvious abnormality, atraumatic Respiratory: Respirations even and unlabored, normal respiratory rate Heart: rate and rhythm normal. No gallop or murmurs noted on exam  Abdomen: BS +, no distention, no rebound tenderness, or no mass Extremities: No gross deformities Skin: Fine urticaria palmer bilateral hands, fine papule upper lip Psych: Appropriate mood and affect. Neurologic: No focal abnormality  UC Treatments / Results  Labs (all labs ordered are listed, but only abnormal results are displayed) Labs Reviewed - No data to display  EKG   Radiology No results found.  Procedures Procedures (including critical care time)  Medications Ordered in UC Medications  dexamethasone (DECADRON) injection 10 mg (10 mg Intramuscular Given 11/05/21 2044)    Initial Impression / Assessment and Plan / UC Course  I have reviewed the triage vital signs and the nursing notes.  Pertinent labs & imaging results that were available during my care of the patient were reviewed by me and considered in my medical decision making (see  chart for details).    Urticaria, suspected allergic reaction.  Decadron 10 mg IM given here in clinic.  Patient will continue home management with Benadryl as needed.  Encouraged to launder all bedding and invade dog to ensure irritant source is removed.  Return as needed. Final Clinical Impressions(s) / UC Diagnoses   Final diagnoses:  Urticaria  Allergic reaction, initial encounter     Discharge Instructions      Take benadryl 25 mg tonight prior to bedtime.  You received a steroid shot which should resolve any developing skin reaction.  Recommend laundering all of your bedding and bathing your pet to rule out any reexposure to suspected irritant.    ED Prescriptions   None    PDMP not reviewed this encounter.   Bing Neighbors, [place] 11/05/21 2138

## 2021-11-05 NOTE — ED Triage Notes (Signed)
Pt presents with allergic reaction. States both hands are itching, minimal lip swelling, tongue itching and eyes itching.

## 2021-11-05 NOTE — Discharge Instructions (Addendum)
Take benadryl 25 mg tonight prior to bedtime.  You received a steroid shot which should resolve any developing skin reaction.  Recommend laundering all of your bedding and bathing your pet to rule out any reexposure to suspected irritant.

## 2024-08-24 ENCOUNTER — Emergency Department (HOSPITAL_BASED_OUTPATIENT_CLINIC_OR_DEPARTMENT_OTHER)

## 2024-08-24 ENCOUNTER — Other Ambulatory Visit: Payer: Self-pay

## 2024-08-24 ENCOUNTER — Emergency Department (HOSPITAL_BASED_OUTPATIENT_CLINIC_OR_DEPARTMENT_OTHER)
Admission: EM | Admit: 2024-08-24 | Discharge: 2024-08-24 | Disposition: A | Discharge status: Home / Self Care | Attending: Emergency Medicine | Admitting: Emergency Medicine

## 2024-08-24 ENCOUNTER — Encounter (HOSPITAL_BASED_OUTPATIENT_CLINIC_OR_DEPARTMENT_OTHER): Payer: Self-pay

## 2024-08-24 DIAGNOSIS — R202 Paresthesia of skin: Secondary | ICD-10-CM | POA: Diagnosis not present

## 2024-08-24 DIAGNOSIS — R12 Heartburn: Secondary | ICD-10-CM | POA: Diagnosis present

## 2024-08-24 LAB — CBC WITH DIFFERENTIAL/PLATELET
Abs Immature Granulocytes: 0.02 K/uL (ref 0.00–0.07)
Basophils Absolute: 0 K/uL (ref 0.0–0.1)
Basophils Relative: 0 %
Eosinophils Absolute: 0.3 K/uL (ref 0.0–0.5)
Eosinophils Relative: 4 %
HCT: 37.7 % (ref 36.0–46.0)
Hemoglobin: 12.6 g/dL (ref 12.0–15.0)
Immature Granulocytes: 0 %
Lymphocytes Relative: 37 %
Lymphs Abs: 2.6 K/uL (ref 0.7–4.0)
MCH: 28.3 pg (ref 26.0–34.0)
MCHC: 33.4 g/dL (ref 30.0–36.0)
MCV: 84.5 fL (ref 80.0–100.0)
Monocytes Absolute: 0.5 K/uL (ref 0.1–1.0)
Monocytes Relative: 8 %
Neutro Abs: 3.6 K/uL (ref 1.7–7.7)
Neutrophils Relative %: 51 %
Platelets: 398 K/uL (ref 150–400)
RBC: 4.46 MIL/uL (ref 3.87–5.11)
RDW: 12.8 % (ref 11.5–15.5)
WBC: 7 K/uL (ref 4.0–10.5)
nRBC: 0 % (ref 0.0–0.2)

## 2024-08-24 LAB — COMPREHENSIVE METABOLIC PANEL WITH GFR
ALT: 17 U/L (ref 0–44)
AST: 23 U/L (ref 15–41)
Albumin: 4.6 g/dL (ref 3.5–5.0)
Alkaline Phosphatase: 90 U/L (ref 38–126)
Anion gap: 13 (ref 5–15)
BUN: 13 mg/dL (ref 6–20)
CO2: 21 mmol/L — ABNORMAL LOW (ref 22–32)
Calcium: 9.5 mg/dL (ref 8.9–10.3)
Chloride: 103 mmol/L (ref 98–111)
Creatinine, Ser: 0.65 mg/dL (ref 0.44–1.00)
GFR, Estimated: >60 mL/min (ref >60)
Glucose, Bld: 103 mg/dL — ABNORMAL HIGH (ref 70–99)
Potassium: 4.3 mmol/L (ref 3.5–5.1)
Sodium: 137 mmol/L (ref 135–145)
Total Bilirubin: 0.3 mg/dL (ref 0.0–1.2)
Total Protein: 7.7 g/dL (ref 6.5–8.1)

## 2024-08-24 LAB — TROPONIN T, HIGH SENSITIVITY: Troponin T High Sensitivity: <15 ng/L (ref 0–19)

## 2024-08-24 MED ORDER — ALUM & MAG HYDROXIDE-SIMETH 200-200-20 MG/5ML PO SUSP
30.0000 mL | Freq: Once | ORAL | Status: AC
  Administered 2024-08-24: 30 mL via ORAL
  Filled 2024-08-24: qty 30

## 2024-08-24 MED ORDER — LIDOCAINE VISCOUS HCL 2 % MT SOLN
15.0000 mL | Freq: Once | OROMUCOSAL | Status: AC
  Administered 2024-08-24: 15 mL via ORAL
  Filled 2024-08-24: qty 15

## 2024-08-24 NOTE — ED Triage Notes (Signed)
 Patient here POV from Home.  Endorses Heartburn type pain that began 5 days ago. Left arm tingling that began two days ago. Intermittent in nature.   History of GERD. No discernable SOB. Some nausea last week. No Emesis.  NAD Noted during triage. A&Ox4. Gcs 15. Ambulatory.

## 2024-08-24 NOTE — ED Notes (Signed)
 Pt alert and oriented X 4 at the time of discharge. RR even and unlabored. No acute distress noted. Pt verbalized understanding of discharge instructions as discussed. Pt ambulatory to lobby at time of discharge.

## 2024-08-24 NOTE — ED Provider Notes (Signed)
 [facility] Provider Note   CSN: [id number] Arrival date & time: 08/24/24  1758     Patient presents with: Heartburn   Misty Gordon is a 28 y.o. female presents today for heartburn type pain that began 5 days ago with intermittent left arm tingling that began on Monday.  Patient does have a history of GERD and has been taking fexofenadine.  Patient denies vomiting, shortness of breath, emesis, abdominal pain, diarrhea, or any other complaints at this time.    Heartburn       Prior to Admission medications   Medication Sig Start Date End Date Taking? Authorizing Provider  benzonatate  (TESSALON ) 100 MG capsule Take 1 capsule (100 mg total) by mouth every 8 (eight) hours. 10/16/21   Emil Share, DO  LO LOESTRIN FE  1 MG-10 MCG / 10 MCG tablet Take 1 tablet by mouth daily. 05/12/18   Constant, Peggy, MD  ondansetron  (ZOFRAN  ODT) 4 MG disintegrating tablet 4mg  ODT q4 hours prn nausea/vomit 10/16/21   Floyd, Dan, DO    Allergies: Patient has no known allergies.    Review of Systems  Gastrointestinal:  Positive for heartburn.    Updated Vital Signs BP 130/84 (BP Location: Right Arm)   Pulse 98   Temp 98.4 F (36.9 C)   Resp 18   Ht 5' 2 (1.575 m)   Wt 70.3 kg   SpO2 99%   BMI 28.35 kg/m   Physical Exam Vitals and nursing note reviewed.  Constitutional:      General: She is not in acute distress.    Appearance: She is well-developed. She is not ill-appearing.  HENT:     Head: Normocephalic and atraumatic.     Right Ear: External ear normal.     Left Ear: External ear normal.     Nose: Nose normal.  Eyes:     Conjunctiva/sclera: Conjunctivae normal.  Cardiovascular:     Rate and Rhythm: Normal rate and regular rhythm.     Pulses: Normal pulses.     Heart sounds: Normal heart sounds. No murmur heard. Pulmonary:     Effort: Pulmonary effort is normal. No respiratory distress.     Breath sounds: Normal breath sounds.   Abdominal:     Palpations: Abdomen is soft.     Tenderness: There is no abdominal tenderness.  Musculoskeletal:        General: No swelling.     Cervical back: Normal range of motion and neck supple.  Skin:    General: Skin is warm and dry.     Capillary Refill: Capillary refill takes less than 2 seconds.  Neurological:     General: No focal deficit present.     Mental Status: She is alert and oriented to person, place, and time.  Psychiatric:        Mood and Affect: Mood normal.     (all labs ordered are listed, but only abnormal results are displayed) Labs Reviewed  COMPREHENSIVE METABOLIC PANEL WITH GFR - Abnormal; Notable for the following components:      Result Value   CO2 21 (*)    Glucose, Bld 103 (*)    All other components within normal limits  CBC WITH DIFFERENTIAL/PLATELET  TROPONIN T, HIGH SENSITIVITY    EKG: EKG Interpretation Date/Time:  Thursday August 24 2024 18:13:28 EDT Ventricular Rate:  99 PR Interval:  181 QRS Duration:  74 QT Interval:  337 QTC Calculation: 433 R Axis:   76  Text Interpretation: Sinus rhythm Confirmed by Ruthe Cornet [phone]) on 08/24/2024 6:14:32 PM  Radiology: [facility] Chest 2 View Result Date: 08/24/2024 CLINICAL DATA:  pain Endorses Heartburn type pain that began 5 days ago. Left arm tingling that began two days ago. Intermittent in nature. Pt shielded. Lmp= now EXAM: CHEST - 2 VIEW COMPARISON:  Chest x-ray 03/29/2018 FINDINGS: The heart and mediastinal contours are within normal limits. No focal consolidation. No pulmonary edema. No pleural effusion. No pneumothorax. No acute osseous abnormality. IMPRESSION: No active cardiopulmonary disease. Electronically Signed   By: Morgane  Naveau M.D.   On: 08/24/2024 19:00     Procedures   Medications Ordered in the ED  alum & mag hydroxide-simeth (MAALOX/MYLANTA) 200-200-20 MG/5ML suspension 30 mL (has no administration in time range)    And  lidocaine  (XYLOCAINE ) 2 % viscous mouth  solution 15 mL (has no administration in time range)                                    Medical Decision Making Amount and/or Complexity of Data Reviewed Labs: ordered. Radiology: ordered.   This patient presents to the ED for concern of heartburn/left arm pain, this involves an extensive number of treatment options, and is a complaint that carries with it a high risk of complications and morbidity.  The differential diagnosis includes STEMI, NSTEMI, GERD, arrhythmia, anemia, electrolyte abnormality, etc.   Co morbidities / Chronic conditions that complicate the patient evaluation  GERD  Lab Tests:  I Ordered, and personally interpreted labs.  The pertinent results include: Troponin less than 15, mildly reduced CO2 at 21, CBC unremarkable   Imaging Studies ordered:  I ordered imaging studies including chest x-ray I independently visualized and interpreted imaging which showed no active cardiopulmonary disease I agree with the radiologist interpretation   Cardiac Monitoring: / EKG:  The patient was maintained on a cardiac monitor.  I personally viewed and interpreted the cardiac monitored which showed an underlying rhythm of: Sinus rhythm   Problem List / ED Course / Critical interventions / Medication management I ordered medication including GI cocktail I have reviewed the patients home medicines and have made adjustments as needed   Test / Admission - Considered:  Considered for admission or further workup however patient's vital signs, physical exam, labs, imaging are reassuring.  Patient symptoms likely due to acid reflux.  Patient given GI cocktail while in ED.  Patient given return precautions.  I feel patient safe for discharge at this time.     Final diagnoses:  Heartburn    ED Discharge Orders     None          Francis Ileana LOISE DEVONNA 08/24/24 2016    Ruthe Cornet, DO 08/24/24 2200

## 2024-08-24 NOTE — Discharge Instructions (Signed)
 Today you were seen for heartburn.  Your workup while in the emergency department was reassuring.  Please follow-up with your PCP if your symptoms persist for further evaluation and workup.  Thank you for letting us  treat you today. After reviewing your labs and imaging, I feel you are safe to go home. Please follow up with your PCP in the next several days and provide them with your records from this visit. Return to the Emergency Room if pain becomes severe or symptoms worsen.
